# Patient Record
Sex: Female | Born: 2000 | Race: Black or African American | Hispanic: No | Marital: Single | Smoking: Current some day smoker
Health system: Southern US, Community
[De-identification: ages and names within clinical notes are randomized; demographics above are authoritative.]

---
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
## 2019-10-02 ENCOUNTER — Emergency Department (HOSPITAL_COMMUNITY): Payer: Medicaid Other

## 2019-10-02 ENCOUNTER — Other Ambulatory Visit: Payer: Self-pay

## 2019-10-02 ENCOUNTER — Emergency Department (HOSPITAL_COMMUNITY)
Admission: EM | Admit: 2019-10-02 | Discharge: 2019-10-03 | Disposition: A | Discharge status: Home / Self Care | Payer: Medicaid Other | Attending: Emergency Medicine | Admitting: Emergency Medicine

## 2019-10-02 ENCOUNTER — Encounter (HOSPITAL_COMMUNITY): Payer: Self-pay

## 2019-10-02 DIAGNOSIS — R55 Syncope and collapse: Secondary | ICD-10-CM | POA: Diagnosis not present

## 2019-10-02 DIAGNOSIS — Z7722 Contact with and (suspected) exposure to environmental tobacco smoke (acute) (chronic): Secondary | ICD-10-CM | POA: Diagnosis not present

## 2019-10-02 DIAGNOSIS — R0602 Shortness of breath: Secondary | ICD-10-CM | POA: Insufficient documentation

## 2019-10-02 DIAGNOSIS — R070 Pain in throat: Secondary | ICD-10-CM | POA: Insufficient documentation

## 2019-10-02 DIAGNOSIS — R4189 Other symptoms and signs involving cognitive functions and awareness: Secondary | ICD-10-CM

## 2019-10-02 DIAGNOSIS — R4182 Altered mental status, unspecified: Secondary | ICD-10-CM | POA: Insufficient documentation

## 2019-10-02 DIAGNOSIS — R0789 Other chest pain: Secondary | ICD-10-CM | POA: Insufficient documentation

## 2019-10-02 DIAGNOSIS — R079 Chest pain, unspecified: Secondary | ICD-10-CM

## 2019-10-02 LAB — I-STAT BETA HCG BLOOD, ED (MC, WL, AP ONLY): I-stat hCG, quantitative: <5 m[IU]/mL (ref <5)

## 2019-10-02 LAB — BASIC METABOLIC PANEL
Anion gap: 9 (ref 5–15)
BUN: 16 mg/dL (ref 6–20)
CO2: 25 mmol/L (ref 22–32)
Calcium: 9.1 mg/dL (ref 8.9–10.3)
Chloride: 107 mmol/L (ref 98–111)
Creatinine, Ser: 0.91 mg/dL (ref 0.44–1.00)
GFR calc Af Amer: >60 mL/min (ref >60)
GFR calc non Af Amer: >60 mL/min (ref >60)
Glucose, Bld: 96 mg/dL (ref 70–99)
Potassium: 3.6 mmol/L (ref 3.5–5.1)
Sodium: 141 mmol/L (ref 135–145)

## 2019-10-02 LAB — CBC
HCT: 33.9 % — ABNORMAL LOW (ref 36.0–46.0)
Hemoglobin: 10.3 g/dL — ABNORMAL LOW (ref 12.0–15.0)
MCH: 25.8 pg — ABNORMAL LOW (ref 26.0–34.0)
MCHC: 30.4 g/dL (ref 30.0–36.0)
MCV: 84.8 fL (ref 80.0–100.0)
Platelets: 339 10*3/uL (ref 150–400)
RBC: 4 MIL/uL (ref 3.87–5.11)
RDW: 15.7 % — ABNORMAL HIGH (ref 11.5–15.5)
WBC: 7.7 10*3/uL (ref 4.0–10.5)
nRBC: 0 % (ref 0.0–0.2)

## 2019-10-02 LAB — TROPONIN I (HIGH SENSITIVITY): Troponin I (High Sensitivity): <2 ng/L (ref <18)

## 2019-10-02 MED ORDER — SODIUM CHLORIDE 0.9% FLUSH: 3.0000 mL | Freq: Once | INTRAVENOUS | Status: DC

## 2019-10-02 MED ORDER — ACETAMINOPHEN 325 MG PO TABS
650.0000 mg | ORAL_TABLET | Freq: Once | ORAL | Status: AC
  Administered 2019-10-02: 22:00:00 650 mg via ORAL
  Filled 2019-10-02: qty 2

## 2019-10-02 NOTE — Discharge Instructions (Addendum)
You are seen in the emergency department for being unresponsive and having chest pain.  You had blood work EKG chest x-ray that did not show any serious findings.  Please use Tylenol or ibuprofen as needed for pain.  Rest and fluids.  Return to the emergency department if any worsening or concerning symptoms

## 2019-10-02 NOTE — ED Provider Notes (Signed)
[facility]-EMERGENCY DEPT Provider Note   CSN: [id number] Arrival date & time: 10/02/19  2110     History Chief Complaint  Patient presents with  . Chest Pain    Linda Nichols is a 19 y.o. female.  She presented to triage with decreased responsiveness.  History is obtained from the patient and her boyfriend.  He said that she became very upset and started crying hard.  Then she started passing out.  He tried to administer mouth-to-mouth to her.  He never did chest compressions.  Currently she is awake and alert.  She is complaining of some sharp pain in her chest and some pain in her throat.  She denies any medical history.  She said she had a panic attack once a long time ago.  The history is provided by the patient.  Chest Pain Pain location:  R chest Pain quality: sharp   Pain radiates to:  Does not radiate Pain severity:  Moderate Onset quality:  Unable to specify Timing:  Intermittent Progression:  Unchanged Chronicity:  New Context: breathing and movement   Relieved by:  None tried Worsened by:  Certain positions, deep breathing and coughing Ineffective treatments:  None tried Associated symptoms: altered mental status, shortness of breath and syncope   Associated symptoms: no abdominal pain, no back pain, no cough, no fever, no nausea and no vomiting        History reviewed. No pertinent past medical history.  There are no problems to display for this patient.   History reviewed. No pertinent surgical history.   OB History   No obstetric history on file.     History reviewed. No pertinent family history.  Social History   Tobacco Use  . Smoking status: Passive Smoke Exposure - Never Smoker  Substance Use Topics  . Alcohol use: No  . Drug use: Not on file    Home Medications Prior to Admission medications   Medication Sig Start Date End Date Taking? Authorizing Provider  ondansetron (ZOFRAN) 4 MG tablet Take 1 tablet (4 mg  total) by mouth every 8 (eight) hours as needed for nausea or vomiting. Patient not taking: Reported on 10/02/2019 12/16/14   Charm Rings, MD    Allergies    Aspirin  Review of Systems   Review of Systems  Constitutional: Negative for fever.  HENT: Positive for sore throat.   Eyes: Negative for visual disturbance.  Respiratory: Positive for shortness of breath. Negative for cough.   Cardiovascular: Positive for chest pain and syncope.  Gastrointestinal: Negative for abdominal pain, nausea and vomiting.  Genitourinary: Negative for dysuria.  Musculoskeletal: Negative for back pain.  Skin: Negative for rash.  Neurological: Positive for syncope.    Physical Exam Updated Vital Signs BP (!) 132/99   Pulse 84   Temp 97.9 F (36.6 C) (Oral)   Resp 13   LMP 10/02/2019   SpO2 98%   Physical Exam Vitals and nursing note reviewed.  Constitutional:      General: She is not in acute distress.    Appearance: She is well-developed.  HENT:     Head: Normocephalic and atraumatic.  Eyes:     Conjunctiva/sclera: Conjunctivae normal.  Cardiovascular:     Rate and Rhythm: Normal rate and regular rhythm.     Heart sounds: Normal heart sounds. No murmur.  Pulmonary:     Effort: Pulmonary effort is normal. No respiratory distress.     Breath sounds: Normal breath sounds.  Abdominal:  Palpations: Abdomen is soft.     Tenderness: There is no abdominal tenderness.  Musculoskeletal:        General: Normal range of motion.     Cervical back: Neck supple.     Right lower leg: No tenderness.     Left lower leg: No tenderness.  Skin:    General: Skin is warm and dry.     Capillary Refill: Capillary refill takes less than 2 seconds.  Neurological:     General: No focal deficit present.     Mental Status: She is alert.     ED Results / Procedures / Treatments   Labs (all labs ordered are listed, but only abnormal results are displayed) Labs Reviewed  CBC - Abnormal; Notable for  the following components:      Result Value   Hemoglobin 10.3 (*)    HCT 33.9 (*)    MCH 25.8 (*)    RDW 15.7 (*)    All other components within normal limits  BASIC METABOLIC PANEL  I-STAT BETA HCG BLOOD, ED ([facility], [facility], [facility] ONLY)  TROPONIN I (HIGH SENSITIVITY)    EKG EKG Interpretation  Date/Time:  Tuesday Oct 02 2019 21:17:59 EDT Ventricular Rate:  74 PR Interval:    QRS Duration: 80 QT Interval:  395 QTC Calculation: 439 R Axis:   77 Text Interpretation: Sinus rhythm No old tracing to compare Confirmed by Aletta Edouard [id number]) on 10/02/2019 9:44:04 PM   Radiology DG Chest 2 View  Result Date: 10/02/2019 CLINICAL DATA:  Onset shortness of breath this evening. Chest tightness. EXAM: CHEST - 2 VIEW COMPARISON:  None. FINDINGS: Lungs clear. Heart size normal. No pneumothorax or pleural fluid. No acute or focal bony abnormality. IMPRESSION: Negative chest. Electronically Signed   By: Inge Rise M.D.   On: 10/02/2019 21:43    Procedures Procedures (including critical care time)  Medications Ordered in ED Medications  acetaminophen (TYLENOL) tablet 650 mg (650 mg Oral Given 10/02/19 2225)    ED Course  I have reviewed the triage vital signs and the nursing notes.  Pertinent labs & imaging results that were available during my care of the patient were reviewed by me and considered in my medical decision making (see chart for details).  Clinical Course as of Oct 01 2205  Tue Oct 02, 2019  2200 Chest x-ray interpreted by me as No gross infiltrates or pneumothorax.   [MB]    Clinical Course User Index [MB] Hayden Rasmussen, MD   MDM Rules/Calculators/A&P                     This patient complains of syncope chest pain sore throat; this involves an extensive number of treatment Options and is a complaint that carries with it a high risk of complications and Morbidity. The differential includes panic attack, pneumothorax, ACS, metabolic derangement  I ordered,  reviewed and interpreted labs, which included CBC which shows normal white count, low hemoglobin patient states she knows this from prior no available records to compare with, normal chemistries, less than 2, pregnancy negative. I ordered medication Tylenol with improvement in her pain I ordered imaging studies which included chest x-ray and I independently    visualized and interpreted imaging which showed no gross infiltrates or pneumothorax Additional history obtained from boyfriend After the interventions stated above, I reevaluated the patient and found patient symptoms to be improved.  Satting 100% on room air pulse 70 respirations normal.  Normal mental status.  Likely anxiety related.  Return instructions discussed   Final Clinical Impression(s) / ED Diagnoses Final diagnoses:  Unresponsive episode  Nonspecific chest pain    Rx / DC Orders ED Discharge Orders    None       Terrilee Files, MD 10/03/19 1207

## 2019-10-02 NOTE — ED Triage Notes (Addendum)
Pt states she was "crying so hard I could not breathe, my boyfriend was talking about leaving me". Pt slow to respond in triage, but verbal and communicating in room. Pt was dropped off by a person who was unable to give any information other than patients name, unnamed person carried patient in to ED lobby. Pt c/o chest pain. Pt denied taking any drugs.

## 2020-05-05 ENCOUNTER — Emergency Department (HOSPITAL_COMMUNITY)
Admission: EM | Admit: 2020-05-05 | Discharge: 2020-05-05 | Disposition: A | Discharge status: Home / Self Care | Payer: Medicaid Other | Attending: Emergency Medicine | Admitting: Emergency Medicine

## 2020-05-05 ENCOUNTER — Other Ambulatory Visit: Payer: Self-pay

## 2020-05-05 DIAGNOSIS — Z5321 Procedure and treatment not carried out due to patient leaving prior to being seen by health care provider: Secondary | ICD-10-CM | POA: Insufficient documentation

## 2020-05-05 DIAGNOSIS — L609 Nail disorder, unspecified: Secondary | ICD-10-CM | POA: Diagnosis present

## 2020-05-05 NOTE — ED Triage Notes (Signed)
Pt reports the acrylic nails came off of her first and third fingers on her L hand, taking her real nail with it, yesterday when she was attacked by a cat.

## 2021-02-16 IMAGING — CR DG CHEST 2V
2 series · 2 of 2 positions shown · non-contrast
Comparison: None.

CLINICAL DATA: Onset shortness of breath this evening. Chest
tightness.

EXAM:
CHEST - 2 VIEW

[w chest lat]
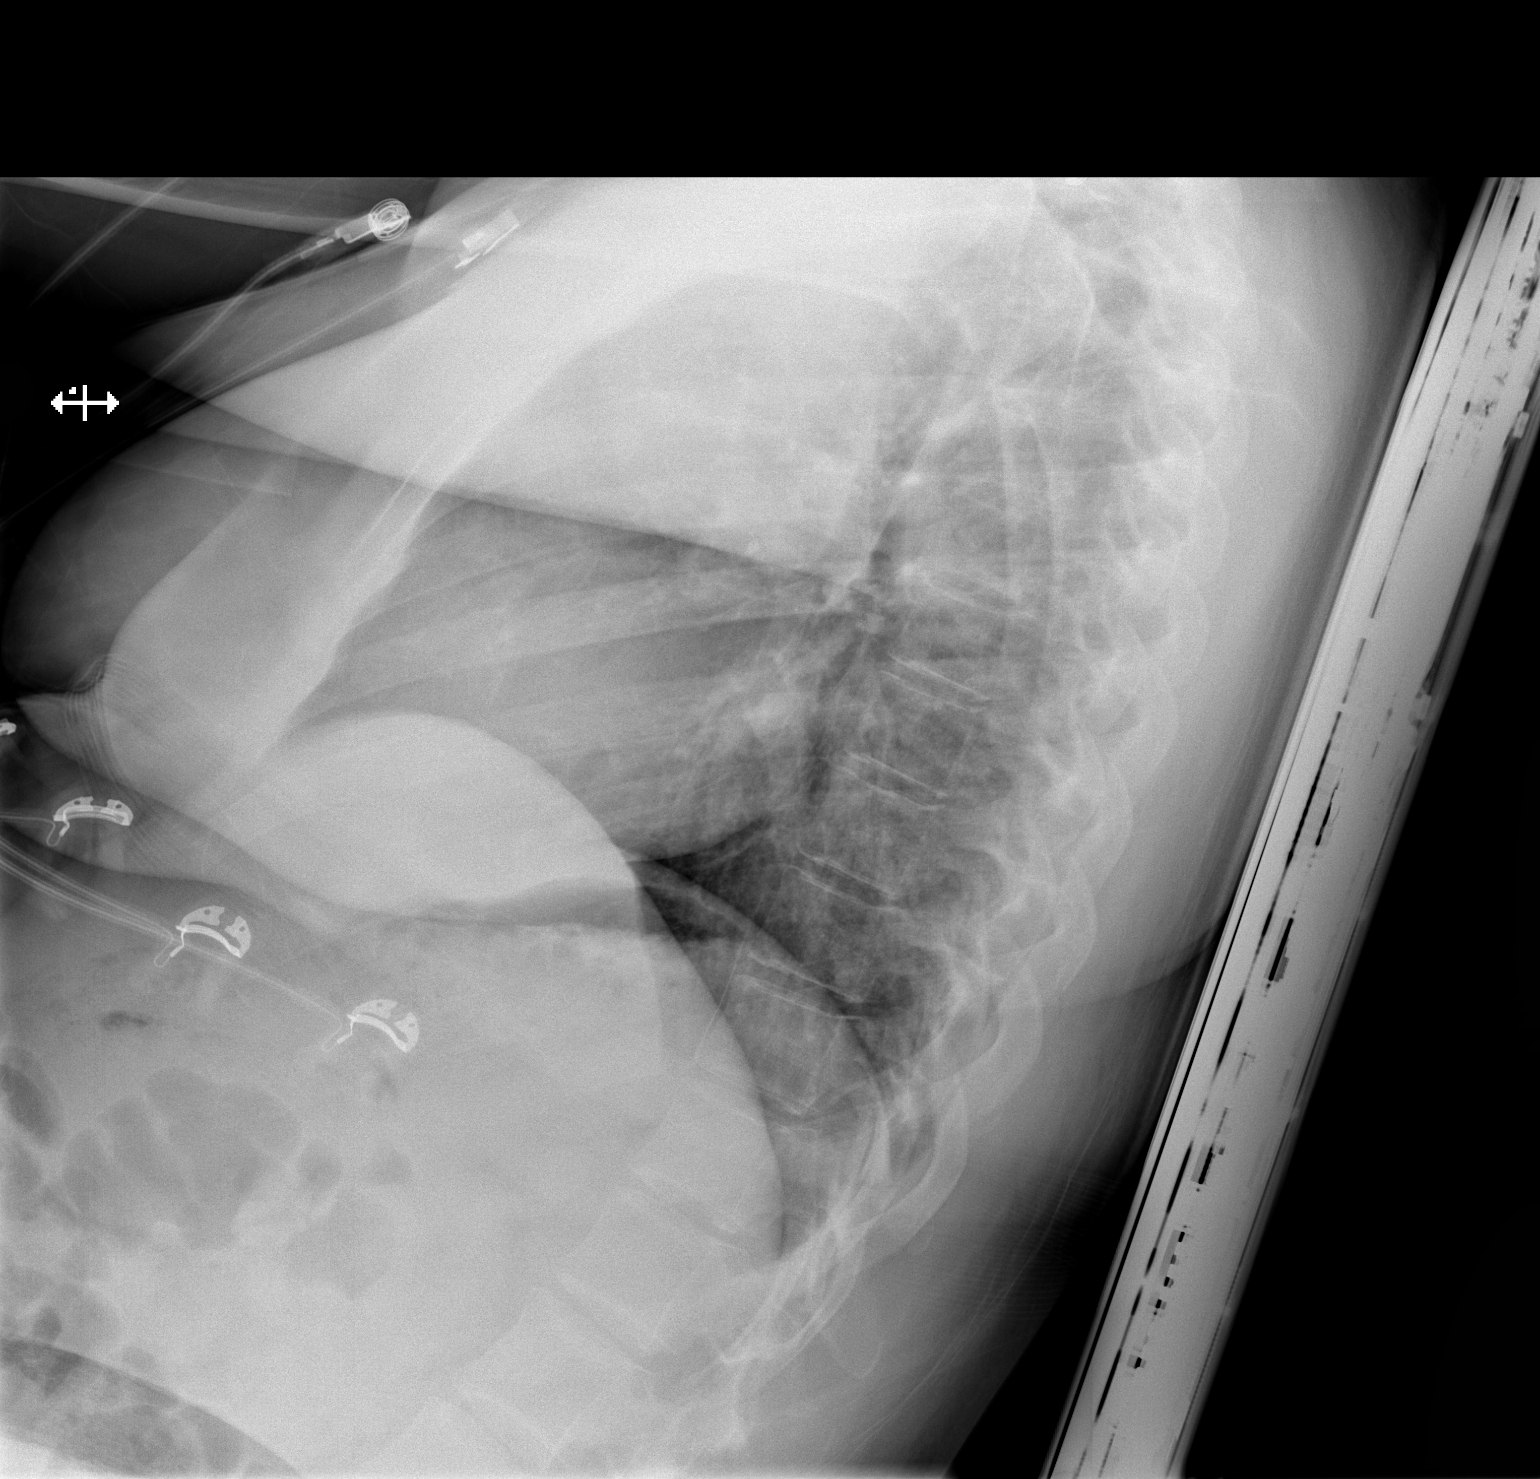

[x chest ap]
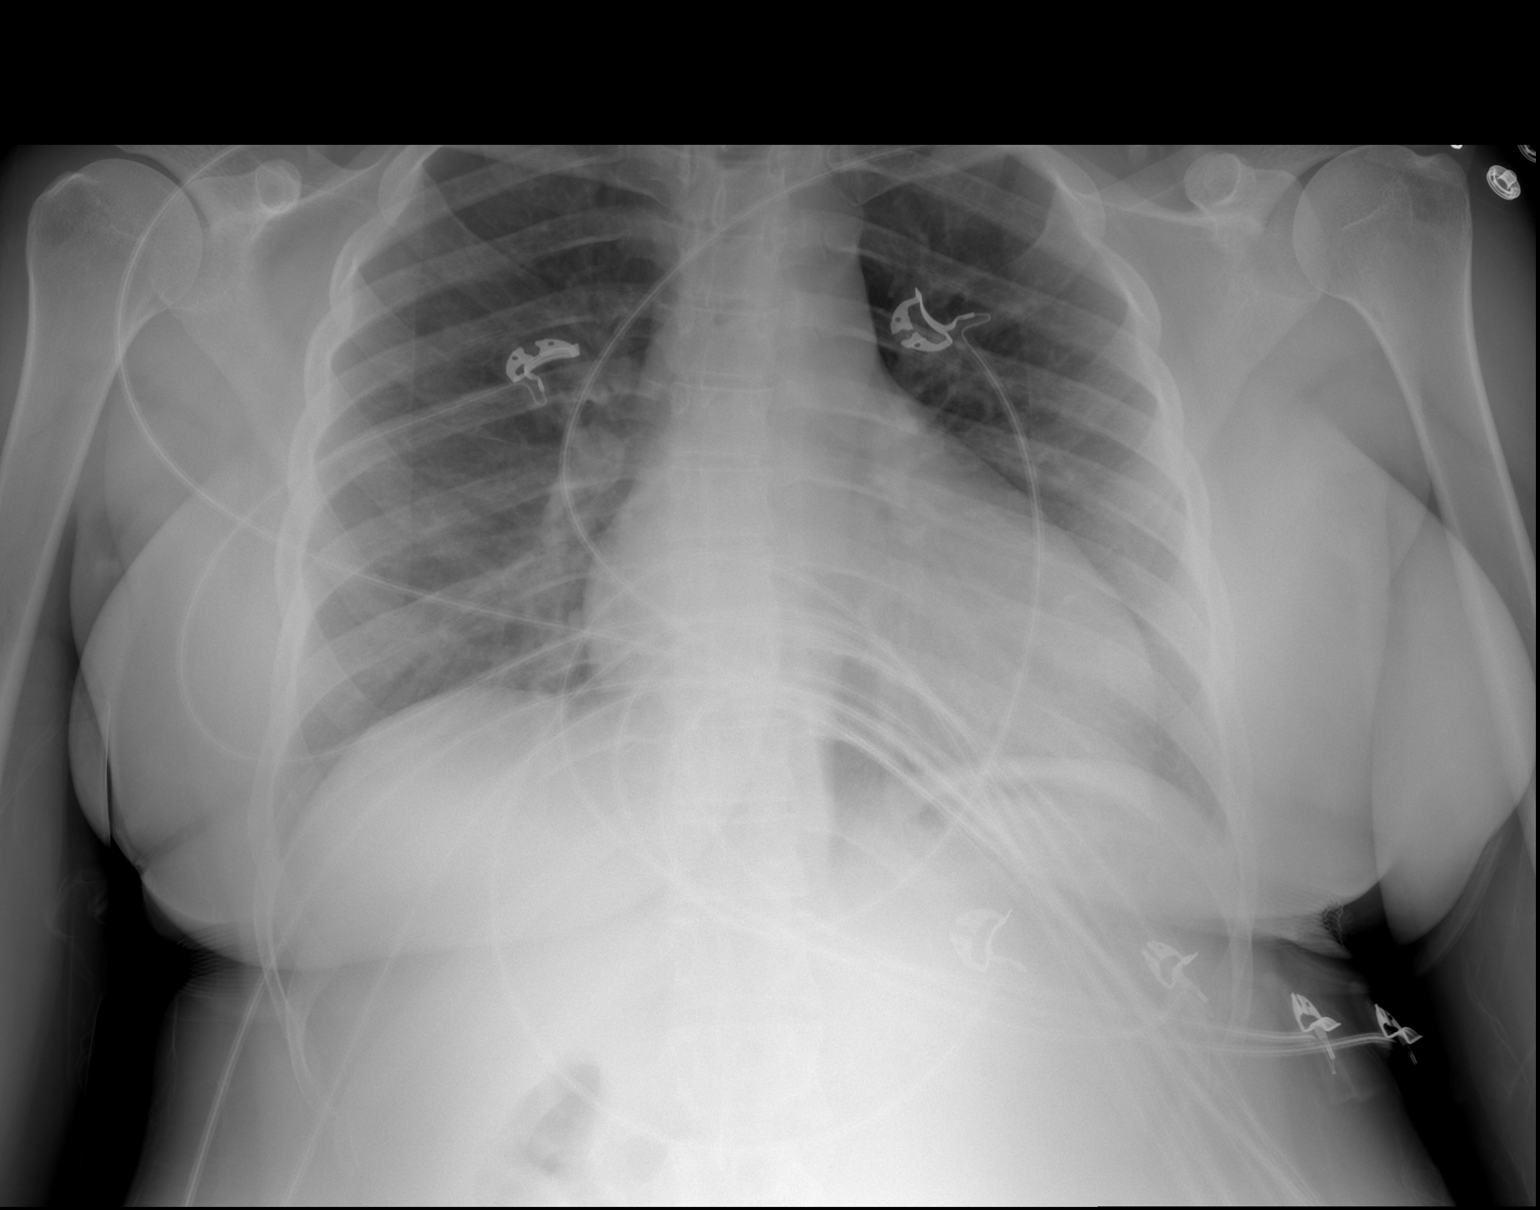

[2 of 2 positions shown; findings below may reference images not displayed]

FINDINGS: Lungs clear. Heart size normal. No pneumothorax or pleural fluid. No
acute or focal bony abnormality.
IMPRESSION: Negative chest.

## 2021-02-21 ENCOUNTER — Emergency Department (HOSPITAL_COMMUNITY)
Admission: EM | Admit: 2021-02-21 | Discharge: 2021-02-21 | Disposition: A | Discharge status: Home / Self Care | Payer: Medicaid Other | Attending: Emergency Medicine | Admitting: Emergency Medicine

## 2021-02-21 ENCOUNTER — Encounter (HOSPITAL_COMMUNITY): Payer: Self-pay

## 2021-02-21 ENCOUNTER — Other Ambulatory Visit: Payer: Self-pay

## 2021-02-21 DIAGNOSIS — A599 Trichomoniasis, unspecified: Secondary | ICD-10-CM | POA: Insufficient documentation

## 2021-02-21 DIAGNOSIS — N898 Other specified noninflammatory disorders of vagina: Secondary | ICD-10-CM | POA: Diagnosis present

## 2021-02-21 DIAGNOSIS — F17210 Nicotine dependence, cigarettes, uncomplicated: Secondary | ICD-10-CM | POA: Insufficient documentation

## 2021-02-21 LAB — URINALYSIS, ROUTINE W REFLEX MICROSCOPIC
Bilirubin Urine: NEGATIVE
Glucose, UA: NEGATIVE mg/dL
Hgb urine dipstick: NEGATIVE
Ketones, ur: NEGATIVE mg/dL
Nitrite: NEGATIVE
Protein, ur: NEGATIVE mg/dL
Specific Gravity, Urine: 1.028 (ref 1.005–1.030)
WBC, UA: >50 WBC/hpf — ABNORMAL HIGH (ref 0–5)
pH: 5 (ref 5.0–8.0)

## 2021-02-21 LAB — WET PREP, GENITAL
Clue Cells Wet Prep HPF POC: NONE SEEN
Sperm: NONE SEEN
Yeast Wet Prep HPF POC: NONE SEEN

## 2021-02-21 LAB — I-STAT BETA HCG BLOOD, ED (MC, WL, AP ONLY): I-stat hCG, quantitative: <5 m[IU]/mL (ref <5)

## 2021-02-21 LAB — HIV ANTIBODY (ROUTINE TESTING W REFLEX): HIV Screen 4th Generation wRfx: NONREACTIVE

## 2021-02-21 MED ORDER — CEFTRIAXONE SODIUM 500 MG IJ SOLR
500.0000 mg | Freq: Once | INTRAMUSCULAR | Status: AC
  Administered 2021-02-21: 500 mg via INTRAMUSCULAR
  Filled 2021-02-21: qty 500

## 2021-02-21 MED ORDER — DOXYCYCLINE HYCLATE 100 MG PO TABS: 100.0000 mg | ORAL_TABLET | Freq: Two times a day (BID) | ORAL | 0 refills | Status: DC

## 2021-02-21 MED ORDER — METRONIDAZOLE 500 MG PO TABS
2000.0000 mg | ORAL_TABLET | Freq: Once | ORAL | Status: AC
  Administered 2021-02-21: 2000 mg via ORAL
  Filled 2021-02-21: qty 4

## 2021-02-21 MED ORDER — DOXYCYCLINE HYCLATE 100 MG PO TABS: 100.0000 mg | ORAL_TABLET | Freq: Two times a day (BID) | ORAL | 0 refills | Status: AC

## 2021-02-21 MED ORDER — LIDOCAINE HCL (PF) 1 % IJ SOLN
1.0000 mL | Freq: Once | INTRAMUSCULAR | Status: AC
  Administered 2021-02-21: 1 mL
  Filled 2021-02-21: qty 5

## 2021-02-21 NOTE — Discharge Instructions (Signed)
Make sure to take the antibiotics until complete.  The test today was positive for trichomonas and you were given treatment for that in the emergency room.  Follow-up with an OB/GYN doctor or primary care doctor to be rechecked if the symptoms persist

## 2021-02-21 NOTE — ED Provider Notes (Signed)
[facility] EMERGENCY DEPARTMENT Provider Note   CSN: [id number] Arrival date & time: 02/21/21  7824     History Chief complaint: Vaginal discharge, possible STI  Linda Nichols is a 20 y.o. female.  HPI  Patient presents to the ED for evaluation of vaginal discharge.  Patient states she had intercourse with her partner within the last week.  She was notified that he tested positive for some type of STI since then.  They could not tell her what type.  Patient has noticed some vaginal discharge as well as some discomfort in the vaginal area.  She has noticed a slight odor.  No fevers or chills.  No abdominal pain.  Some urinary discomfort.  She has not noticed any rashes or skin lesions.  No past medical history on file.  There are no problems to display for this patient.   No past surgical history on file.   OB History   No obstetric history on file.     No family history on file.  Social History   Tobacco Use   Smoking status: Some Days    Types: Cigarettes    Passive exposure: Yes   Smokeless tobacco: Never  Substance Use Topics   Alcohol use: No    Home Medications Prior to Admission medications   Medication Sig Start Date End Date Taking? Authorizing Provider  doxycycline (VIBRA-TABS) 100 MG tablet Take 1 tablet (100 mg total) by mouth 2 (two) times daily for 7 days. 02/21/21 02/28/21  Linwood Dibbles, MD  ondansetron (ZOFRAN) 4 MG tablet Take 1 tablet (4 mg total) by mouth every 8 (eight) hours as needed for nausea or vomiting. Patient not taking: Reported on 10/02/2019 12/16/14   Charm Rings, MD    Allergies    Aspirin  Review of Systems   Review of Systems  All other systems reviewed and are negative.  Physical Exam Updated Vital Signs BP (!) 122/92 (BP Location: Right Arm)   Pulse 85   Temp 98 F (36.7 C) (Oral)   Resp 16   SpO2 100%   Physical Exam Vitals and nursing note reviewed. Exam conducted with a chaperone present.   Constitutional:      General: She is not in acute distress.    Appearance: She is well-developed.  HENT:     Head: Normocephalic and atraumatic.     Right Ear: External ear normal.     Left Ear: External ear normal.  Eyes:     General: No scleral icterus.       Right eye: No discharge.        Left eye: No discharge.     Conjunctiva/sclera: Conjunctivae normal.  Neck:     Trachea: No tracheal deviation.  Cardiovascular:     Rate and Rhythm: Normal rate.  Pulmonary:     Effort: Pulmonary effort is normal. No respiratory distress.     Breath sounds: No stridor.  Abdominal:     General: There is no distension.  Genitourinary:    General: Normal vulva.     Pubic Area: No rash.      Labia:        Right: No rash.        Left: No rash.      Vagina: Vaginal discharge present. No erythema.     Cervix: Discharge present. No cervical bleeding.     Uterus: Deviated. Not tender.      Adnexa: Right adnexa normal and left adnexa normal.  Musculoskeletal:        General: No swelling or deformity.     Cervical back: Neck supple.  Skin:    General: Skin is warm and dry.     Findings: No rash.  Neurological:     Mental Status: She is alert.     Cranial Nerves: Cranial nerve deficit: no gross deficits.    ED Results / Procedures / Treatments   Labs (all labs ordered are listed, but only abnormal results are displayed) Labs Reviewed  WET PREP, GENITAL - Abnormal; Notable for the following components:      Result Value   Trich, Wet Prep PRESENT (*)    WBC, Wet Prep HPF POC MANY (*)    All other components within normal limits  URINALYSIS, ROUTINE W REFLEX MICROSCOPIC - Abnormal; Notable for the following components:   APPearance HAZY (*)    Leukocytes,Ua LARGE (*)    WBC, UA >50 (*)    Bacteria, UA RARE (*)    All other components within normal limits  URINE CULTURE  RPR  HIV ANTIBODY (ROUTINE TESTING W REFLEX)  I-STAT BETA HCG BLOOD, ED ([facility], [facility], [facility] ONLY)  GC/CHLAMYDIA PROBE  AMP ([place]) NOT AT [facility]    EKG None  Radiology No results found.  Procedures Procedures   Medications Ordered in ED Medications  cefTRIAXone (ROCEPHIN) injection 500 mg (has no administration in time range)  lidocaine (PF) (XYLOCAINE) 1 % injection 1 mL (has no administration in time range)  metroNIDAZOLE (FLAGYL) tablet 2,000 mg (has no administration in time range)    ED Course  I have reviewed the triage vital signs and the nursing notes.  Pertinent labs & imaging results that were available during my care of the patient were reviewed by me and considered in my medical decision making (see chart for details).    MDM Rules/Calculators/A&P                           Patient presented to the ED for evaluation of of vaginal discharge and discomfort.  Patient with known STI exposure although she was not told what kind.  Patient did have vaginal discharge on exam.  ED work-up notable for trichomonas.  Urinalysis suggest infection but I think this is likely related to her vaginal discharge and discomfort.  We will send off a urine culture.  We will treat empirically for gonorrhea and chlamydia.  We will also treat for trichomonas.  RPR and HIV screening ordered Final Clinical Impression(s) / ED Diagnoses Final diagnoses:  Trichomonal infection    Rx / DC Orders ED Discharge Orders          Ordered    doxycycline (VIBRA-TABS) 100 MG tablet  2 times daily,   Status:  Discontinued        02/21/21 0958    doxycycline (VIBRA-TABS) 100 MG tablet  2 times daily        02/21/21 6010             Linwood Dibbles, MD 02/21/21 1020
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
## 2021-02-22 LAB — RPR: RPR Ser Ql: NONREACTIVE

## 2021-02-22 LAB — URINE CULTURE: Culture: NO GROWTH

## 2021-02-23 LAB — GC/CHLAMYDIA PROBE AMP (~~LOC~~) NOT AT ARMC
Chlamydia: NEGATIVE
Comment: NEGATIVE
Comment: NORMAL
Neisseria Gonorrhea: POSITIVE — AB
